# Patient Record
Sex: Female | Born: 1935 | Race: White | Hispanic: No | Marital: Single | Smoking: Never smoker
Health system: Southern US, Community
[De-identification: ages and names within clinical notes are randomized; demographics above are authoritative.]

## PROBLEM LIST (undated history)

## (undated) DIAGNOSIS — F0390 Unspecified dementia without behavioral disturbance: Secondary | ICD-10-CM

## (undated) DIAGNOSIS — I10 Essential (primary) hypertension: Secondary | ICD-10-CM

---

## 2007-08-09 ENCOUNTER — Emergency Department (HOSPITAL_COMMUNITY): Admission: EM | Admit: 2007-08-09 | Discharge: 2007-08-09 | Payer: Self-pay | Admitting: Emergency Medicine

## 2008-02-10 ENCOUNTER — Emergency Department (HOSPITAL_COMMUNITY): Admission: EM | Admit: 2008-02-10 | Discharge: 2008-02-10 | Payer: Self-pay | Admitting: Emergency Medicine

## 2009-04-08 ENCOUNTER — Emergency Department (HOSPITAL_COMMUNITY): Admission: EM | Admit: 2009-04-08 | Discharge: 2009-04-08 | Payer: Self-pay | Admitting: Emergency Medicine

## 2009-08-18 ENCOUNTER — Emergency Department (HOSPITAL_COMMUNITY): Admission: EM | Admit: 2009-08-18 | Discharge: 2009-08-18 | Payer: Self-pay | Admitting: Emergency Medicine

## 2010-03-24 ENCOUNTER — Emergency Department (HOSPITAL_COMMUNITY): Admission: EM | Admit: 2010-03-24 | Discharge: 2010-03-25 | Payer: Self-pay | Admitting: Emergency Medicine

## 2010-05-05 ENCOUNTER — Encounter (INDEPENDENT_AMBULATORY_CARE_PROVIDER_SITE_OTHER): Payer: Self-pay | Admitting: Emergency Medicine

## 2010-05-05 ENCOUNTER — Emergency Department (HOSPITAL_COMMUNITY)
Admission: EM | Admit: 2010-05-05 | Discharge: 2010-05-05 | Payer: Self-pay | Source: Home / Self Care | Admitting: Emergency Medicine

## 2010-07-31 ENCOUNTER — Emergency Department (HOSPITAL_COMMUNITY)
Admission: EM | Admit: 2010-07-31 | Discharge: 2010-07-31 | Disposition: A | Discharge status: Home / Self Care | Payer: Medicare Other | Attending: Emergency Medicine | Admitting: Emergency Medicine

## 2010-07-31 ENCOUNTER — Emergency Department (HOSPITAL_COMMUNITY): Payer: Medicare Other

## 2010-07-31 DIAGNOSIS — R059 Cough, unspecified: Secondary | ICD-10-CM | POA: Insufficient documentation

## 2010-07-31 DIAGNOSIS — I10 Essential (primary) hypertension: Secondary | ICD-10-CM | POA: Insufficient documentation

## 2010-07-31 DIAGNOSIS — F29 Unspecified psychosis not due to a substance or known physiological condition: Secondary | ICD-10-CM | POA: Insufficient documentation

## 2010-07-31 DIAGNOSIS — N390 Urinary tract infection, site not specified: Secondary | ICD-10-CM | POA: Insufficient documentation

## 2010-07-31 DIAGNOSIS — R10819 Abdominal tenderness, unspecified site: Secondary | ICD-10-CM | POA: Insufficient documentation

## 2010-07-31 DIAGNOSIS — R05 Cough: Secondary | ICD-10-CM | POA: Insufficient documentation

## 2010-07-31 DIAGNOSIS — F0390 Unspecified dementia without behavioral disturbance: Secondary | ICD-10-CM | POA: Insufficient documentation

## 2010-07-31 DIAGNOSIS — E785 Hyperlipidemia, unspecified: Secondary | ICD-10-CM | POA: Insufficient documentation

## 2010-07-31 DIAGNOSIS — Z79899 Other long term (current) drug therapy: Secondary | ICD-10-CM | POA: Insufficient documentation

## 2010-07-31 DIAGNOSIS — R079 Chest pain, unspecified: Secondary | ICD-10-CM | POA: Insufficient documentation

## 2010-07-31 LAB — URINALYSIS, ROUTINE W REFLEX MICROSCOPIC
Bilirubin Urine: NEGATIVE
Nitrite: NEGATIVE
Specific Gravity, Urine: 1.017 (ref 1.005–1.030)
Urobilinogen, UA: 0.2 mg/dL (ref 0.0–1.0)

## 2010-07-31 LAB — BASIC METABOLIC PANEL
Calcium: 8.8 mg/dL (ref 8.4–10.5)
GFR calc Af Amer: 44 mL/min — ABNORMAL LOW (ref >60)
GFR calc non Af Amer: 37 mL/min — ABNORMAL LOW (ref >60)
Sodium: 136 mEq/L (ref 135–145)

## 2010-07-31 LAB — DIFFERENTIAL
Basophils Absolute: 0 10*3/uL (ref 0.0–0.1)
Basophils Relative: 0 % (ref 0–1)
Eosinophils Relative: 2 % (ref 0–5)
Monocytes Absolute: 1.1 10*3/uL — ABNORMAL HIGH (ref 0.1–1.0)
Neutro Abs: 6.9 10*3/uL (ref 1.7–7.7)

## 2010-07-31 LAB — CBC
MCHC: 33.7 g/dL (ref 30.0–36.0)
RDW: 12.9 % (ref 11.5–15.5)

## 2010-07-31 LAB — URINE MICROSCOPIC-ADD ON

## 2010-08-01 LAB — COMPREHENSIVE METABOLIC PANEL
BUN: 25 mg/dL — ABNORMAL HIGH (ref 6–23)
Calcium: 9.5 mg/dL (ref 8.4–10.5)
Creatinine, Ser: 1.47 mg/dL — ABNORMAL HIGH (ref 0.4–1.2)
Glucose, Bld: 101 mg/dL — ABNORMAL HIGH (ref 70–99)
Sodium: 142 mEq/L (ref 135–145)
Total Protein: 7.5 g/dL (ref 6.0–8.3)

## 2010-08-01 LAB — URINE CULTURE
Colony Count: >=100000
Culture  Setup Time: 201112152213

## 2010-08-01 LAB — POCT CARDIAC MARKERS
CKMB, poc: <1 ng/mL — ABNORMAL LOW (ref 1.0–8.0)
Myoglobin, poc: 86.4 ng/mL (ref 12–200)

## 2010-08-01 LAB — URINE MICROSCOPIC-ADD ON

## 2010-08-01 LAB — URINALYSIS, ROUTINE W REFLEX MICROSCOPIC
Glucose, UA: NEGATIVE mg/dL
Protein, ur: 100 mg/dL — AB

## 2010-08-01 LAB — DIFFERENTIAL
Lymphocytes Relative: 36 % (ref 12–46)
Lymphs Abs: 3.4 10*3/uL (ref 0.7–4.0)
Monocytes Relative: 12 % (ref 3–12)
Neutro Abs: 4.6 10*3/uL (ref 1.7–7.7)
Neutrophils Relative %: 49 % (ref 43–77)

## 2010-08-01 LAB — CBC
HCT: 42.9 % (ref 36.0–46.0)
MCH: 28.9 pg (ref 26.0–34.0)
MCHC: 32.6 g/dL (ref 30.0–36.0)
MCV: 88.5 fL (ref 78.0–100.0)
RDW: 12.9 % (ref 11.5–15.5)

## 2010-08-01 LAB — LIPASE, BLOOD: Lipase: 58 U/L (ref 11–59)

## 2010-08-02 LAB — DIFFERENTIAL
Basophils Relative: 0 % (ref 0–1)
Eosinophils Absolute: 0.3 10*3/uL (ref 0.0–0.7)
Eosinophils Relative: 3 % (ref 0–5)
Lymphs Abs: 3.5 10*3/uL (ref 0.7–4.0)
Monocytes Relative: 12 % (ref 3–12)

## 2010-08-02 LAB — POCT I-STAT, CHEM 8
Calcium, Ion: 1.06 mmol/L — ABNORMAL LOW (ref 1.12–1.32)
Chloride: 110 mEq/L (ref 96–112)
Creatinine, Ser: 1.6 mg/dL — ABNORMAL HIGH (ref 0.4–1.2)
Glucose, Bld: 98 mg/dL (ref 70–99)
Potassium: 3.9 mEq/L (ref 3.5–5.1)

## 2010-08-02 LAB — CBC
MCH: 29 pg (ref 26.0–34.0)
MCHC: 33.3 g/dL (ref 30.0–36.0)
MCV: 87.1 fL (ref 78.0–100.0)
Platelets: 221 10*3/uL (ref 150–400)
RBC: 4.8 MIL/uL (ref 3.87–5.11)

## 2010-08-02 LAB — BASIC METABOLIC PANEL
BUN: 18 mg/dL (ref 6–23)
Calcium: 8.7 mg/dL (ref 8.4–10.5)
Creatinine, Ser: 1.39 mg/dL — ABNORMAL HIGH (ref 0.4–1.2)
GFR calc non Af Amer: 36 mL/min — ABNORMAL LOW (ref >60)
Glucose, Bld: 108 mg/dL — ABNORMAL HIGH (ref 70–99)

## 2010-08-02 LAB — PROTIME-INR: Prothrombin Time: 13.1 seconds (ref 11.6–15.2)

## 2010-08-02 LAB — POCT CARDIAC MARKERS
CKMB, poc: <1 ng/mL — ABNORMAL LOW (ref 1.0–8.0)
CKMB, poc: <1 ng/mL — ABNORMAL LOW (ref 1.0–8.0)
Myoglobin, poc: 66.8 ng/mL (ref 12–200)
Troponin i, poc: <0.05 ng/mL (ref 0.00–0.09)

## 2010-08-15 LAB — POCT CARDIAC MARKERS
CKMB, poc: <1 ng/mL — ABNORMAL LOW (ref 1.0–8.0)
Myoglobin, poc: 58.5 ng/mL (ref 12–200)
Myoglobin, poc: 89 ng/mL (ref 12–200)

## 2010-08-15 LAB — COMPREHENSIVE METABOLIC PANEL
AST: 25 U/L (ref 0–37)
Albumin: 3.1 g/dL — ABNORMAL LOW (ref 3.5–5.2)
Alkaline Phosphatase: 58 U/L (ref 39–117)
BUN: 19 mg/dL (ref 6–23)
GFR calc Af Amer: 48 mL/min — ABNORMAL LOW (ref >60)
Potassium: 4.3 mEq/L (ref 3.5–5.1)
Sodium: 140 mEq/L (ref 135–145)
Total Protein: 6.5 g/dL (ref 6.0–8.3)

## 2010-08-15 LAB — URINALYSIS, ROUTINE W REFLEX MICROSCOPIC
Glucose, UA: NEGATIVE mg/dL
Hgb urine dipstick: NEGATIVE
Ketones, ur: NEGATIVE mg/dL
Protein, ur: 30 mg/dL — AB

## 2010-08-15 LAB — URINE CULTURE: Colony Count: 15000

## 2010-08-15 LAB — URINE MICROSCOPIC-ADD ON

## 2010-08-15 LAB — DIFFERENTIAL
Basophils Relative: 0 % (ref 0–1)
Monocytes Absolute: 0.9 10*3/uL (ref 0.1–1.0)
Monocytes Relative: 10 % (ref 3–12)
Neutro Abs: 5 10*3/uL (ref 1.7–7.7)

## 2010-08-15 LAB — CBC
Platelets: 233 10*3/uL (ref 150–400)
RDW: 13.7 % (ref 11.5–15.5)

## 2011-01-30 ENCOUNTER — Emergency Department (HOSPITAL_COMMUNITY)
Admission: EM | Admit: 2011-01-30 | Discharge: 2011-01-30 | Disposition: A | Discharge status: Home / Self Care | Payer: Medicare Other | Attending: Emergency Medicine | Admitting: Emergency Medicine

## 2011-01-30 DIAGNOSIS — T495X1A Poisoning by ophthalmological drugs and preparations, accidental (unintentional), initial encounter: Secondary | ICD-10-CM | POA: Insufficient documentation

## 2011-01-30 DIAGNOSIS — Z66 Do not resuscitate: Secondary | ICD-10-CM | POA: Insufficient documentation

## 2011-01-30 DIAGNOSIS — F0390 Unspecified dementia without behavioral disturbance: Secondary | ICD-10-CM | POA: Insufficient documentation

## 2011-01-30 DIAGNOSIS — T4991XA Poisoning by unspecified topical agent, accidental (unintentional), initial encounter: Secondary | ICD-10-CM | POA: Insufficient documentation

## 2011-01-30 DIAGNOSIS — F29 Unspecified psychosis not due to a substance or known physiological condition: Secondary | ICD-10-CM | POA: Insufficient documentation

## 2011-01-30 DIAGNOSIS — I10 Essential (primary) hypertension: Secondary | ICD-10-CM | POA: Insufficient documentation

## 2011-01-30 DIAGNOSIS — Y9210 Unspecified residential institution as the place of occurrence of the external cause: Secondary | ICD-10-CM | POA: Insufficient documentation

## 2011-01-30 DIAGNOSIS — I498 Other specified cardiac arrhythmias: Secondary | ICD-10-CM | POA: Insufficient documentation

## 2011-03-25 IMAGING — CR DG CHEST 1V PORT
1 series · 1 of 1 positions shown · non-contrast
Comparison: CT dated 03/25/2010

CLINICAL DATA: Pain, cough without fever

PORTABLE CHEST - 1 VIEW

[AP]
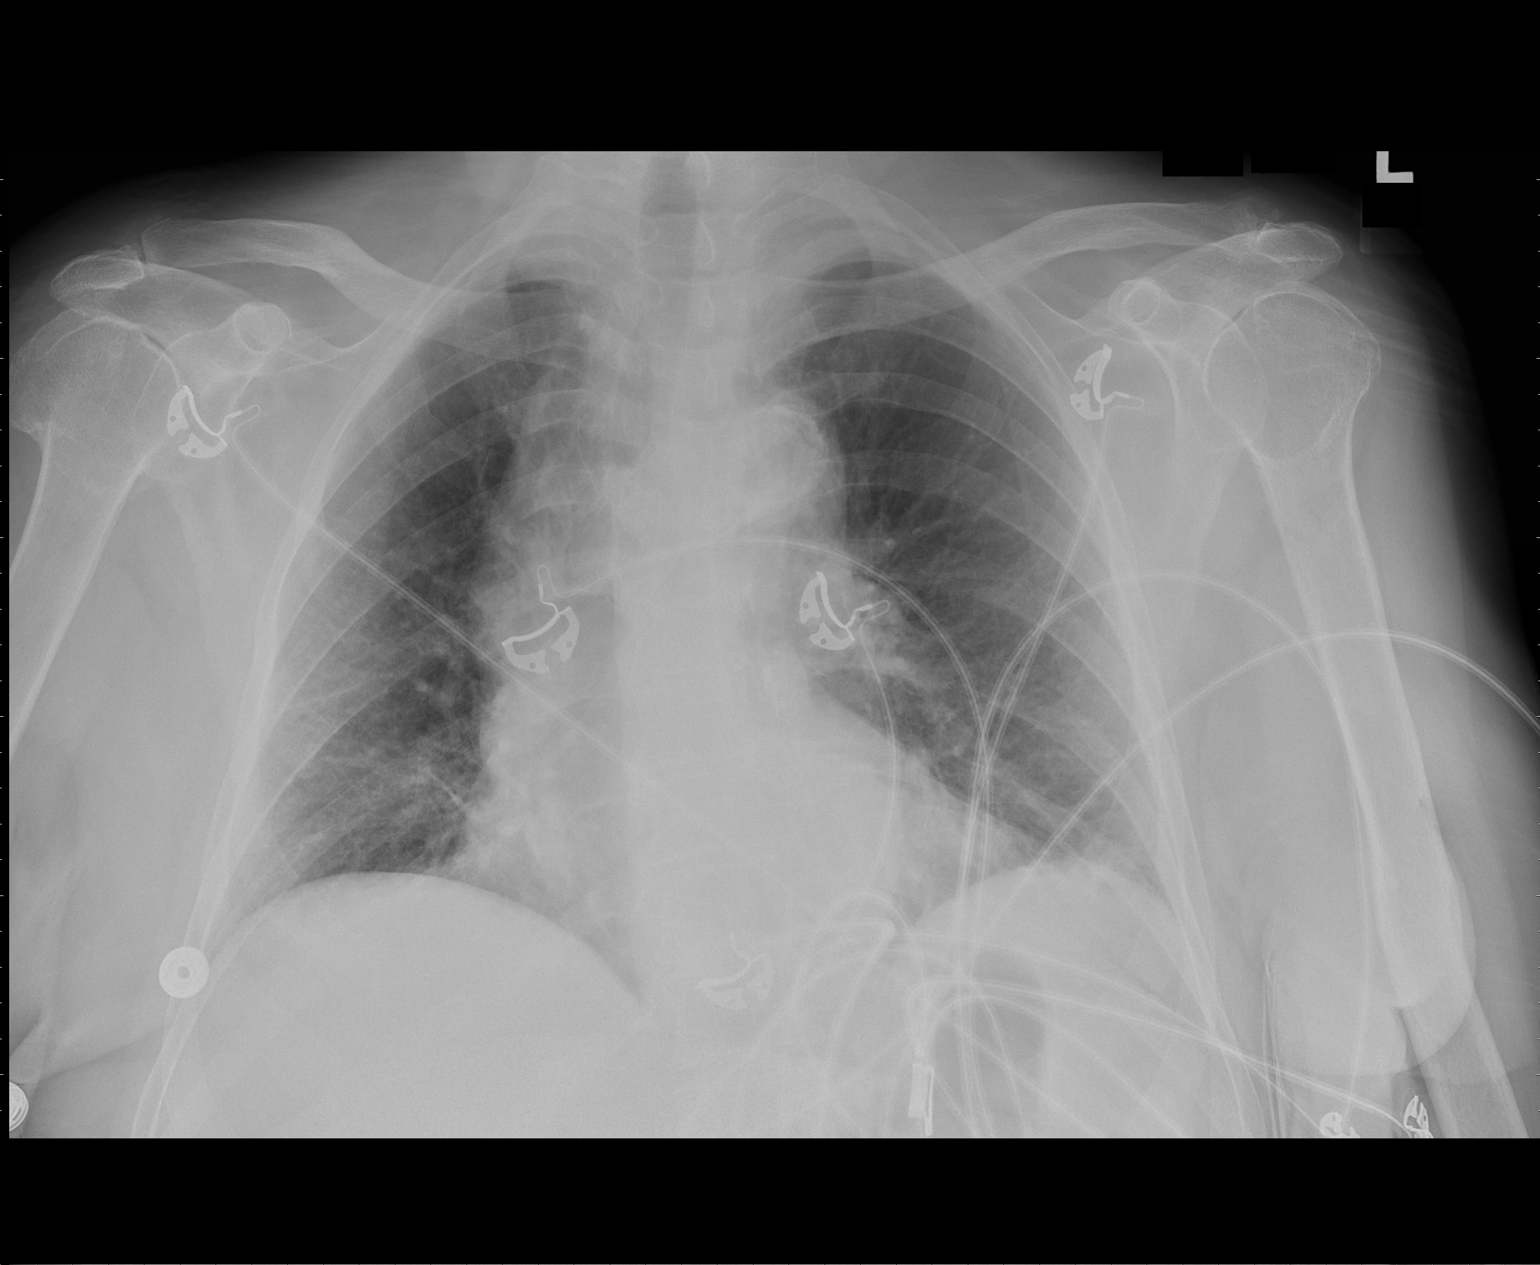

[1 of 1 positions shown; findings below may reference images not displayed]

FINDINGS: There is unchanged scarring versus atelectasis seen at
the left greater than right lung bases.  The lungs are grossly
clear without edema, confluent airspace opacity or large effusion.
The heart is normal in size.  Atherosclerotic calcifications are
seen in the aorta.  The osseous structures upper abdomen are
normal.
IMPRESSION: Bibasilar atelectasis versus scarring.  No acute
cardiopulmonary disease.

## 2014-06-14 ENCOUNTER — Encounter (HOSPITAL_COMMUNITY): Payer: Self-pay | Admitting: Emergency Medicine

## 2014-06-14 ENCOUNTER — Emergency Department (HOSPITAL_COMMUNITY)
Admission: EM | Admit: 2014-06-14 | Discharge: 2014-06-14 | Disposition: A | Discharge status: Home / Self Care | Payer: Medicare Other | Attending: Emergency Medicine | Admitting: Emergency Medicine

## 2014-06-14 DIAGNOSIS — L819 Disorder of pigmentation, unspecified: Secondary | ICD-10-CM

## 2014-06-14 DIAGNOSIS — I10 Essential (primary) hypertension: Secondary | ICD-10-CM | POA: Insufficient documentation

## 2014-06-14 DIAGNOSIS — Z79899 Other long term (current) drug therapy: Secondary | ICD-10-CM | POA: Insufficient documentation

## 2014-06-14 DIAGNOSIS — F0390 Unspecified dementia without behavioral disturbance: Secondary | ICD-10-CM | POA: Diagnosis not present

## 2014-06-14 DIAGNOSIS — R4182 Altered mental status, unspecified: Secondary | ICD-10-CM | POA: Diagnosis present

## 2014-06-14 DIAGNOSIS — L988 Other specified disorders of the skin and subcutaneous tissue: Secondary | ICD-10-CM | POA: Insufficient documentation

## 2014-06-14 HISTORY — DX: Essential (primary) hypertension: I10

## 2014-06-14 HISTORY — DX: Unspecified dementia, unspecified severity, without behavioral disturbance, psychotic disturbance, mood disturbance, and anxiety: F03.90

## 2014-06-14 NOTE — ED Notes (Addendum)
Per EMS. [facility], hx of dementia. Facility reports pt had an episode of altered mental status at 1300 today that resolved prior to EMS arrival at 1500. Facility also reports pt has cold hands and feet bilaterally with toe and finger darkening. Pt's lateral R fingers more purple than other fingers, cap refill under 3 seconds. Facility reports pt is at baseline now.

## 2014-06-14 NOTE — ED Notes (Signed)
Bed: [id number] Expected date:  Expected time:  Means of arrival:  Comments: Poor cir, ? Mental status

## 2014-06-14 NOTE — ED Provider Notes (Addendum)
CSN: [id number]     Arrival date & time 06/14/14  1550 History   First MD Initiated Contact with Patient 06/14/14 1618     Chief Complaint  Patient presents with  . Altered Mental Status     (Consider location/radiation/quality/duration/timing/severity/associated sxs/prior Treatment) HPI Comments:  81 y.o. female who presents with complaint of discoloration of her distal extremities. The family members state they were notified around 2 PM that she had a purplish discoloration of her hands and feet. The son stated that he saw this when he arrived around 2:30 but since that time it has completely resolved. The duration of her symptoms was brief. The severity is noted to be mild. They deny any associated symptoms. She is not had similar symptoms previously. Level 5 caveat due to dementia.   Patient is a 81 y.o. female presenting with altered mental status. The history is provided by the patient.  Altered Mental Status Associated symptoms: no abdominal pain, no fever, no headaches, no nausea and no vomiting     Past Medical History  Diagnosis Date  . Dementia   . Hypertension    History reviewed. No pertinent past surgical history. History reviewed. No pertinent family history. History  Substance Use Topics  . Smoking status: Never Smoker   . Smokeless tobacco: Not on file  . Alcohol Use: No   OB History    No data available     Review of Systems  Constitutional: Negative for fever and fatigue.  HENT: Negative for congestion and drooling.   Eyes: Negative for pain.  Respiratory: Negative for cough and shortness of breath.   Cardiovascular: Negative for chest pain.  Gastrointestinal: Negative for nausea, vomiting, abdominal pain and diarrhea.  Genitourinary: Negative for dysuria and hematuria.  Musculoskeletal: Negative for back pain, gait problem and neck pain.  Skin: Negative for color change.  Neurological: Negative for dizziness and headaches.  Hematological: Negative for  adenopathy.  Psychiatric/Behavioral: Negative for behavioral problems.  All other systems reviewed and are negative.     Allergies  Sulfa antibiotics  Home Medications   Prior to Admission medications   Medication Sig Start Date End Date Taking? Authorizing Provider  atenolol (TENORMIN) 25 MG tablet Take 25 mg by mouth daily.   Yes Historical Provider, MD  Chloroxylenol-Zinc Oxide (BAZA EX) Apply 1 application topically 4 (four) times daily as needed.   Yes Historical Provider, MD  loperamide (IMODIUM A-D) 2 MG tablet Take 2 mg by mouth 4 (four) times daily as needed for diarrhea or loose stools.   Yes Historical Provider, MD  mineral oil liquid Take 1 mL by mouth 2 (two) times a week. In both ears.   Yes Historical Provider, MD  triamterene-hydrochlorothiazide (MAXZIDE-25) 37.5-25 MG per tablet Take 1 tablet by mouth daily.   Yes Historical Provider, MD   BP 152/77 mmHg  Pulse 55  Temp(Src) 98.4 F (36.9 C) (Oral)  Resp 15  SpO2 97% Physical Exam  Constitutional: She appears well-developed and well-nourished.  HENT:  Head: Normocephalic.  Mouth/Throat: Oropharynx is clear and moist. No oropharyngeal exudate.  Eyes: Conjunctivae and EOM are normal. Pupils are equal, round, and reactive to light.  Neck: Normal range of motion. Neck supple.  Cardiovascular: Normal rate, regular rhythm, normal heart sounds and intact distal pulses.  Exam reveals no gallop and no friction rub.   No murmur heard. Pulmonary/Chest: Effort normal and breath sounds normal. No respiratory distress. She has no wheezes.  Abdominal: Soft. Bowel sounds are normal. There  is no tenderness. There is no rebound and no guarding.  Musculoskeletal: Normal range of motion. She exhibits no edema or tenderness.  Normal color and temperature of the distal upper extremities. Normal capillary refill in the upper extremities. 2+ distal radial pulses in the upper extremities.  Slightly cold distal bilateral lower  extremities. Equal bilaterally in temperature. Normal capillary refill in the distal lower extremities. Dopplerable DP pulses in the bilateral distal lower extremities. Feet appear pink bilaterally.   Neurological: She is alert.  Incomprehensible speech. This is baseline per the family. The patient is moving all extremities.  Skin: Skin is warm and dry.  Psychiatric: She has a normal mood and affect. Her behavior is normal.  Nursing note and vitals reviewed.   ED Course  Procedures (including critical care time) Labs Review Labs Reviewed - No data to display  Imaging Review No results found.   EKG Interpretation   Date/Time:  Sunday June 14 2014 17:34:44 EST Ventricular Rate:  56 PR Interval:  164 QRS Duration: 125 QT Interval:  494 QTC Calculation: 477 R Axis:   81 Text Interpretation:  Sinus rhythm Right bundle branch block Baseline  wander in lead(s) V6 No significant change since last tracing Confirmed by  Lucas Winograd  MD, Hiro Vipond ([id number]) on 06/14/2014 5:36:45 PM      MDM   Final diagnoses:  Discoloration of skin    5:20 PM 81 y.o. female who presents with complaint of discoloration of her distal extremity. The family members state they were notified around 2 PM that she had a purplish discoloration of her hands and feet. The son stated that he saw this when he arrived around 2:30 but since that time it has completely resolved. The patient is demented at baseline and has incomprehensible speech on exam. The family noted her to be at baseline on my exam. She has normal color of her bilateral upper and lower extremities. Normal capillary refill throughout. Palpable pulses in the upper extremities and easily dopplerable pulses in the lower extremities performed by me. No reason to suspect acute vascular event. The family is happy with my evaluation and I don't think that any further labs or imaging need to be performed. ECG shows NSR.   5:22 PM:  I have discussed the  diagnosis/risks/treatment options with the patient and believe the pt to be eligible for discharge home to follow-up with her pcp as needed. We also discussed returning to the ED immediately if new or worsening sx occur. We discussed the sx which are most concerning (e.g., further discoloration of ext's) that necessitate immediate return. Medications administered to the patient during their visit and any new prescriptions provided to the patient are listed below.  Medications given during this visit Medications - No data to display  New Prescriptions   No medications on file       [facility] SheffieldForrest Janisse Ghan, MD 06/14/14 1726  [facility]rest Angelo Prindle, MD 06/14/14 1737

## 2014-06-14 NOTE — Discharge Instructions (Signed)
The patient's distal extremities were examined. She has normal color and capillary refill in all extremities. She has 2+ palpable pulses in her upper extremities. She has dopplerable pulses in her bilateral lower extremities. I have a low suspicion for an acute vascular event such as a blood clot.

## 2014-07-21 DEATH — deceased
